# Patient Record
Sex: Female | Born: 1990 | Race: Black or African American | Hispanic: No | Marital: Married
Health system: Midwestern US, Community
[De-identification: ages and names within clinical notes are randomized; demographics above are authoritative.]

## PROBLEM LIST (undated history)

## (undated) DIAGNOSIS — J45909 Unspecified asthma, uncomplicated: Secondary | ICD-10-CM

## (undated) HISTORY — PX: ANKLE FRACTURE SURGERY: SHX122

---
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
## 2013-11-16 ENCOUNTER — Encounter (HOSPITAL_COMMUNITY): Payer: Self-pay | Admitting: Emergency Medicine

## 2013-11-16 ENCOUNTER — Emergency Department (INDEPENDENT_AMBULATORY_CARE_PROVIDER_SITE_OTHER)
Admission: EM | Admit: 2013-11-16 | Discharge: 2013-11-16 | Disposition: A | Discharge status: Home / Self Care | Payer: Self-pay | Source: Home / Self Care | Attending: Family Medicine | Admitting: Family Medicine

## 2013-11-16 ENCOUNTER — Emergency Department (INDEPENDENT_AMBULATORY_CARE_PROVIDER_SITE_OTHER): Payer: Self-pay

## 2013-11-16 DIAGNOSIS — J45901 Unspecified asthma with (acute) exacerbation: Secondary | ICD-10-CM

## 2013-11-16 MED ORDER — MINOCYCLINE HCL 100 MG PO CAPS: 100.0000 mg | ORAL_CAPSULE | Freq: Two times a day (BID) | ORAL | Status: AC

## 2013-11-16 MED ORDER — SODIUM CHLORIDE 0.9 % IN NEBU
INHALATION_SOLUTION | RESPIRATORY_TRACT | Status: AC
  Filled 2013-11-16: qty 3

## 2013-11-16 MED ORDER — IPRATROPIUM BROMIDE 0.02 % IN SOLN
RESPIRATORY_TRACT | Status: AC
  Filled 2013-11-16: qty 2.5

## 2013-11-16 MED ORDER — METHYLPREDNISOLONE 4 MG PO KIT: PACK | ORAL | Status: AC

## 2013-11-16 MED ORDER — METHYLPREDNISOLONE ACETATE 40 MG/ML IJ SUSP
80.0000 mg | Freq: Once | INTRAMUSCULAR | Status: AC
  Administered 2013-11-16: 80 mg via INTRAMUSCULAR

## 2013-11-16 MED ORDER — IPRATROPIUM BROMIDE 0.02 % IN SOLN
0.5000 mg | Freq: Once | RESPIRATORY_TRACT | Status: AC
  Administered 2013-11-16: 0.5 mg via RESPIRATORY_TRACT

## 2013-11-16 MED ORDER — METHYLPREDNISOLONE ACETATE 80 MG/ML IJ SUSP
INTRAMUSCULAR | Status: AC
  Filled 2013-11-16: qty 1

## 2013-11-16 MED ORDER — ALBUTEROL SULFATE (5 MG/ML) 0.5% IN NEBU
INHALATION_SOLUTION | RESPIRATORY_TRACT | Status: AC
  Filled 2013-11-16: qty 1

## 2013-11-16 MED ORDER — ALBUTEROL SULFATE (5 MG/ML) 0.5% IN NEBU
5.0000 mg | INHALATION_SOLUTION | Freq: Once | RESPIRATORY_TRACT | Status: AC
  Administered 2013-11-16: 5 mg via RESPIRATORY_TRACT

## 2013-11-16 NOTE — ED Notes (Signed)
Reported cough, fever for past few days

## 2013-11-16 NOTE — ED Provider Notes (Signed)
CSN: [id number]     Arrival date & time 11/16/13  1936 History   First MD Initiated Contact with Patient 11/16/13 1954     Chief Complaint  Patient presents with  . Cough   (Consider location/radiation/quality/duration/timing/severity/associated sxs/prior Treatment) Patient is a 22 y.o. female presenting with cough. The history is provided by the patient.  Cough Cough characteristics:  Productive Sputum characteristics:  Yellow Severity:  Moderate Onset quality:  Sudden Duration:  2 days Progression:  Worsening Chronicity:  New Smoker: yes   Associated symptoms: chills, fever, rhinorrhea, sore throat and wheezing   Risk factors comment:  Asthma   History reviewed. No pertinent past medical history. History reviewed. No pertinent past surgical history. History reviewed. No pertinent family history. History  Substance Use Topics  . Smoking status: Current Every Day Smoker  . Smokeless tobacco: Not on file  . Alcohol Use: Not on file   OB History   Grav Para Term Preterm Abortions TAB SAB Ect Mult Living                 Review of Systems  Constitutional: Positive for fever and chills.  HENT: Positive for congestion, rhinorrhea and sore throat.   Respiratory: Positive for cough and wheezing.   Gastrointestinal: Negative.     Allergies  Review of patient's allergies indicates no known allergies.  Home Medications   Current Outpatient Rx  Name  Route  Sig  Dispense  Refill  . methylPREDNISolone (MEDROL DOSEPAK) 4 MG tablet      follow package directions. Start on tues, take until finished.   21 tablet   0   . minocycline (MINOCIN,DYNACIN) 100 MG capsule   Oral   Take 1 capsule (100 mg total) by mouth 2 (two) times daily.   20 capsule   0    BP 118/67  Pulse 81  Temp(Src) 98.9 F (37.2 C) (Oral)  Resp 20  SpO2 100%  LMP 11/09/2013 Physical Exam  Nursing note and vitals reviewed. Constitutional: She is oriented to person, place, and time. She appears  well-developed and well-nourished.  HENT:  Head: Normocephalic.  Right Ear: External ear normal.  Left Ear: External ear normal.  Mouth/Throat: Oropharynx is clear and moist.  Eyes: Conjunctivae are normal. Pupils are equal, round, and reactive to light.  Neck: Normal range of motion. Neck supple.  Pulmonary/Chest: Effort normal. She has wheezes. She has rhonchi.  Abdominal: Soft. Bowel sounds are normal.  Lymphadenopathy:    She has no cervical adenopathy.  Neurological: She is alert and oriented to person, place, and time.  Skin: Skin is warm and dry.    ED Course  Procedures (including critical care time) Labs Review Labs Reviewed - No data to display Imaging Review Dg Chest 2 View  11/16/2013   CLINICAL DATA:  Chest pain and cough.  EXAM: CHEST  2 VIEW  COMPARISON:  01/06/2007.  FINDINGS: The heart size and mediastinal contours are within normal limits. Both lungs are clear. The visualized skeletal structures are unremarkable.  IMPRESSION: No active cardiopulmonary disease.   Electronically Signed   By: Loralie Champagne M.D.   On: 11/16/2013 20:38    EKG Interpretation    Date/Time:    Ventricular Rate:    PR Interval:    QRS Duration:   QT Interval:    QTC Calculation:   R Axis:     Text Interpretation:              MDM  Linna Hoff, MD 11/17/13 [phone]

## 2014-06-25 ENCOUNTER — Emergency Department (HOSPITAL_COMMUNITY): Payer: BC Managed Care – PPO

## 2014-06-25 ENCOUNTER — Encounter (HOSPITAL_COMMUNITY): Payer: Self-pay | Admitting: Emergency Medicine

## 2014-06-25 ENCOUNTER — Emergency Department (HOSPITAL_COMMUNITY)
Admission: EM | Admit: 2014-06-25 | Discharge: 2014-06-25 | Disposition: A | Discharge status: Home / Self Care | Payer: BC Managed Care – PPO | Attending: Emergency Medicine | Admitting: Emergency Medicine

## 2014-06-25 DIAGNOSIS — S46911A Strain of unspecified muscle, fascia and tendon at shoulder and upper arm level, right arm, initial encounter: Secondary | ICD-10-CM

## 2014-06-25 DIAGNOSIS — J45909 Unspecified asthma, uncomplicated: Secondary | ICD-10-CM | POA: Insufficient documentation

## 2014-06-25 DIAGNOSIS — IMO0002 Reserved for concepts with insufficient information to code with codable children: Secondary | ICD-10-CM | POA: Insufficient documentation

## 2014-06-25 DIAGNOSIS — F17200 Nicotine dependence, unspecified, uncomplicated: Secondary | ICD-10-CM | POA: Insufficient documentation

## 2014-06-25 DIAGNOSIS — Y9389 Activity, other specified: Secondary | ICD-10-CM | POA: Insufficient documentation

## 2014-06-25 DIAGNOSIS — Z8781 Personal history of (healed) traumatic fracture: Secondary | ICD-10-CM | POA: Insufficient documentation

## 2014-06-25 DIAGNOSIS — X500XXA Overexertion from strenuous movement or load, initial encounter: Secondary | ICD-10-CM | POA: Insufficient documentation

## 2014-06-25 DIAGNOSIS — Y929 Unspecified place or not applicable: Secondary | ICD-10-CM | POA: Insufficient documentation

## 2014-06-25 DIAGNOSIS — Z791 Long term (current) use of non-steroidal anti-inflammatories (NSAID): Secondary | ICD-10-CM | POA: Insufficient documentation

## 2014-06-25 DIAGNOSIS — M25519 Pain in unspecified shoulder: Secondary | ICD-10-CM | POA: Insufficient documentation

## 2014-06-25 HISTORY — DX: Unspecified asthma, uncomplicated: J45.909

## 2014-06-25 MED ORDER — NAPROXEN 500 MG PO TABS: 500.0000 mg | ORAL_TABLET | Freq: Two times a day (BID) | ORAL | Status: AC

## 2014-06-25 NOTE — ED Provider Notes (Signed)
Medical screening examination/treatment/procedure(s) were performed by non-physician practitioner and as supervising physician I was immediately available for consultation/collaboration.   EKG Interpretation None       Jahsiah Carpenter, MD 06/25/14 2011 

## 2014-06-25 NOTE — ED Notes (Addendum)
Pt reports right shoulder pain ever since waking up this am. Pt denies any known injury. No obvious deformity noted.

## 2014-06-25 NOTE — ED Provider Notes (Signed)
CSN: [id number]     Arrival date & time 06/25/14  1504 History   First MD Initiated Contact with Patient 06/25/14 1512     Chief Complaint  Patient presents with  . Shoulder Pain     (Consider location/radiation/quality/duration/timing/severity/associated sxs/prior Treatment) The history is provided by the patient.   Diane Pratt is a 23 y.o. female presenting with pain in her right shoulder which she woke with this am which is constant, sharp and worsened with movement and palpation.  She denies injury but describes possible chronic overuse.  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 She denies radiation of pain or weakness, numbness in her upper extremities.  Also denies neck pain or injury. She has taken no medicines for this prior to arrival.    Past Medical History  Diagnosis Date  . Asthma    Past Surgical History  Procedure Laterality Date  . Ankle fracture surgery Left    History reviewed. No pertinent family history. History  Substance Use Topics  . Smoking status: Current Every Day Smoker -- 0.25 packs/day  . Smokeless tobacco: Not on file  . Alcohol Use: No   OB History   Grav Para Term Preterm Abortions TAB SAB Ect Mult Living                 Review of Systems  Constitutional: Negative for fever.  Musculoskeletal: Positive for arthralgias. Negative for joint swelling and myalgias.  Neurological: Negative for weakness and numbness.      Allergies  Review of patient's allergies indicates no known allergies.  Home Medications   Prior to Admission medications   Medication Sig Start Date End Date Taking? Authorizing Provider  methylPREDNISolone (MEDROL DOSEPAK) 4 MG tablet follow package directions. Start on tues, take until finished. 11/16/13   Linna HoffJames D Kindl, MD  minocycline (MINOCIN,DYNACIN) 100 MG capsule Take 1 capsule (100 mg total) by mouth 2 (two) times daily.  11/16/13   Linna HoffJames D Kindl, MD  naproxen (NAPROSYN) 500 MG tablet Take 1 tablet (500 mg total) by mouth 2 (two) times daily with a meal. 06/25/14   Burgess AmorJulie Mahala Rommel, PA-C   BP 108/58  Pulse 80  Temp(Src) 98.1 F (36.7 C) (Oral)  Resp 18  Ht 5\' 3"  (1.6 m)  Wt 125 lb (56.7 kg)  BMI 22.15 kg/m2  SpO2 99%  LMP 06/18/2014 Physical Exam  Constitutional: She appears well-developed and well-nourished.  HENT:  Head: Atraumatic.  Neck: Normal range of motion.  Cardiovascular:  Pulses equal bilaterally  Musculoskeletal: She exhibits tenderness.       Right shoulder: She exhibits bony tenderness. She exhibits no swelling, no effusion, no crepitus, no deformity, no spasm, normal pulse and normal strength.  ttp with passive and active ROM of right shoulder.  Point tender over anterior humeral head.  No edema, rash, erythema, visible signs of trauma.  Neurological: She is alert. She has normal strength. She displays normal reflexes. No sensory deficit.  Skin: Skin is warm and dry.  Psychiatric: She has a normal mood and affect.    ED Course  Procedures (including critical care time) Labs Review Labs Reviewed - No data to display  Imaging Review Dg Shoulder Right  06/25/2014   CLINICAL DATA:  Chronic right shoulder pain.  EXAM: RIGHT SHOULDER - 2+ VIEW  COMPARISON:  None.  FINDINGS: The humerus is located and the acromioclavicular joint is  intact. No evidence of arthropathy is seen about the shoulder. There is no focal bony lesion. Imaged lung parenchyma and ribs appear normal. Soft tissue structures about the shoulder also appear normal.  IMPRESSION: Normal examination.   Electronically Signed   By: Drusilla Kanner M.D.   On: 06/25/2014 16:01     EKG Interpretation None      MDM   Final diagnoses:  Shoulder strain, right, initial encounter    Patients labs and/or radiological studies were viewed and considered during the medical decision making and disposition process. Naproxen, ice,   Rest.  Referral to Dr Romeo Apple if not improving over the next 7 days.    Burgess Amor, PA-C 06/25/14 [phone]

## 2014-06-25 NOTE — Discharge Instructions (Signed)
Strain A strain is an injury to a muscle or the tissue that connects muscles to bones (tendon). In a strain injury, the muscle or tendon is either stretched or torn. Muscles are more susceptible to strains if they cross two joints, such as:  Hamstrings.  Quadriceps.  Calves.  Biceps. There are three categories of strains:  A first-degree strain is a small tear in the muscle. There is no lengthening of the muscle, but pain may be present with contraction of the muscle.  A second-degree strain is a small tear in the muscle accompanied by lengthening of the muscle. Muscles with a second-degree strain are still able to function.  A third-degree strain is a complete tear of the muscle. Muscles with a third-degree strain cannot function properly. Strains often have bleeding and bruising within the muscle. SYMPTOMS   Pain, tenderness, redness or bruising, and swelling in the area of injury.  Loss of normal mobility of the injured joint. CAUSES  A sudden force exerted on a muscle or tendon that it cannot withstand usually causes strains. This may be due to a sudden overload of a contracted muscle, overuse, or sudden increase or change in activity.  RISK INCREASES WITH:  Trauma.  Poor strength and flexibility.  Failure to warm-up properly before activity.  Return to activity before healing is complete. PREVENTION  Warm-up and stretch properly before and activity.  Maintain physical fitness:  Joint flexibility.  Muscle strength.  Endurance and conditioning.  Strengthen weak muscles with exercises to prevent recurrence. PROGNOSIS  If treated properly, strains are usually curable. The time it takes to recover is related to the severity of the injury and usually varies from 2 to 8 weeks. RELATED COMPLICATIONS   Re-injury or recurrence of symptoms, permanent weakness.  Joint stiffness if the strain is severe and rehabilitation is incomplete.  Delayed healing or resolution of  symptoms if sports are resumed before rehabilitation is complete.  Excessive bleeding into muscle, especially if taking anti-inflammatory medicines. This can lead to delayed recovery and injury to nerves, muscle, and blood vessels; this is an emergency. TREATMENT  Treatment initially involves ice and medicine to help reduce pain and inflammation. Use of the affected muscle should be limited by a:  Brace.  Elastic bandage wrapping.  Splint.  Cast.  Sling. Strengthening and stretching exercises may be necessary after immobilization to prevent joint stiffness. These exercises may be completed at home or with a therapist. If the tendon is torn, then surgery may be necessary to repair it.  MEDICATION   Prescription pain relievers may be prescribed. Use only as directed and only as much as you need  Ointments applied to the skin may be helpful such as icy hot. HEAT AND COLD  Cold treatment (icing) relieves pain and reduces inflammation. Cold treatment should be applied for 10 to 15 minutes every 2 to 3 hours for inflammation and pain and immediately after any activity that aggravates your symptoms. Use ice packs or massage the area with a piece of ice (ice massage).  Heat treatment may be used prior to performing the stretching and strengthening activities prescribed by your caregiver, physical therapist, or athletic trainer. Use a heat pack or soak your injury in warm water. SEEK MEDICAL CARE IF:   Symptoms get worse or do not improve despite treatment.  Pain becomes intolerable.  You experience numbness or tingling.  Toes or fingernails become cold or develop a blue, gray, or dusky color.  New, unexplained symptoms develop (drugs used  in treatment may produce side effects). Document Released: 11/19/2005 Document Revised: 02/11/2012 Document Reviewed: 03/03/2009 [facility] Patient Information 2015 [place], [place]. This information is not intended to replace advice given to you by your  health care provider. Make sure you discuss any questions you have with your health care provider.

## 2014-07-06 ENCOUNTER — Encounter: Payer: Self-pay | Admitting: Orthopedic Surgery

## 2014-07-06 ENCOUNTER — Ambulatory Visit: Payer: BC Managed Care – PPO | Admitting: Orthopedic Surgery

## 2016-01-24 IMAGING — CR DG SHOULDER 2+V*R*
3 series · 3 of 3 positions shown · non-contrast
Comparison: None.

CLINICAL DATA: Chronic right shoulder pain.

EXAM:
RIGHT SHOULDER - 2+ VIEW

[view not recorded (1 of 3)]
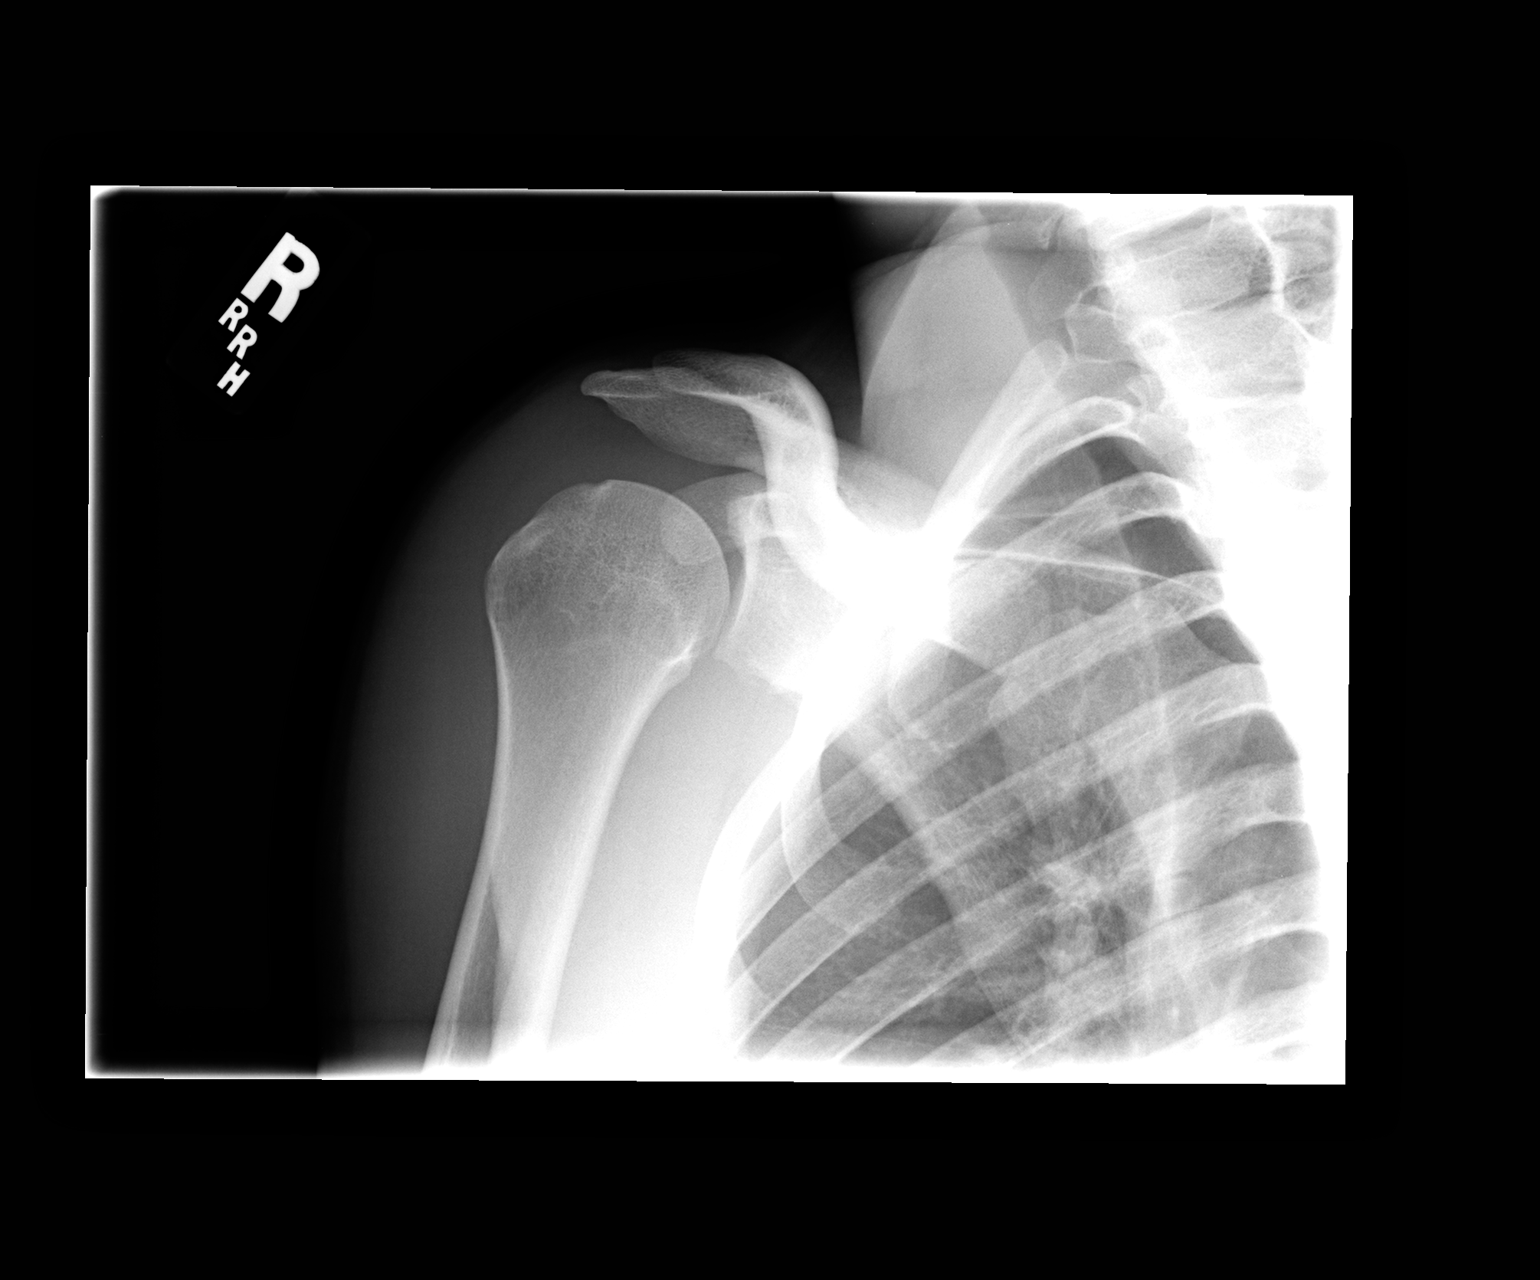

[view not recorded (2 of 3)]
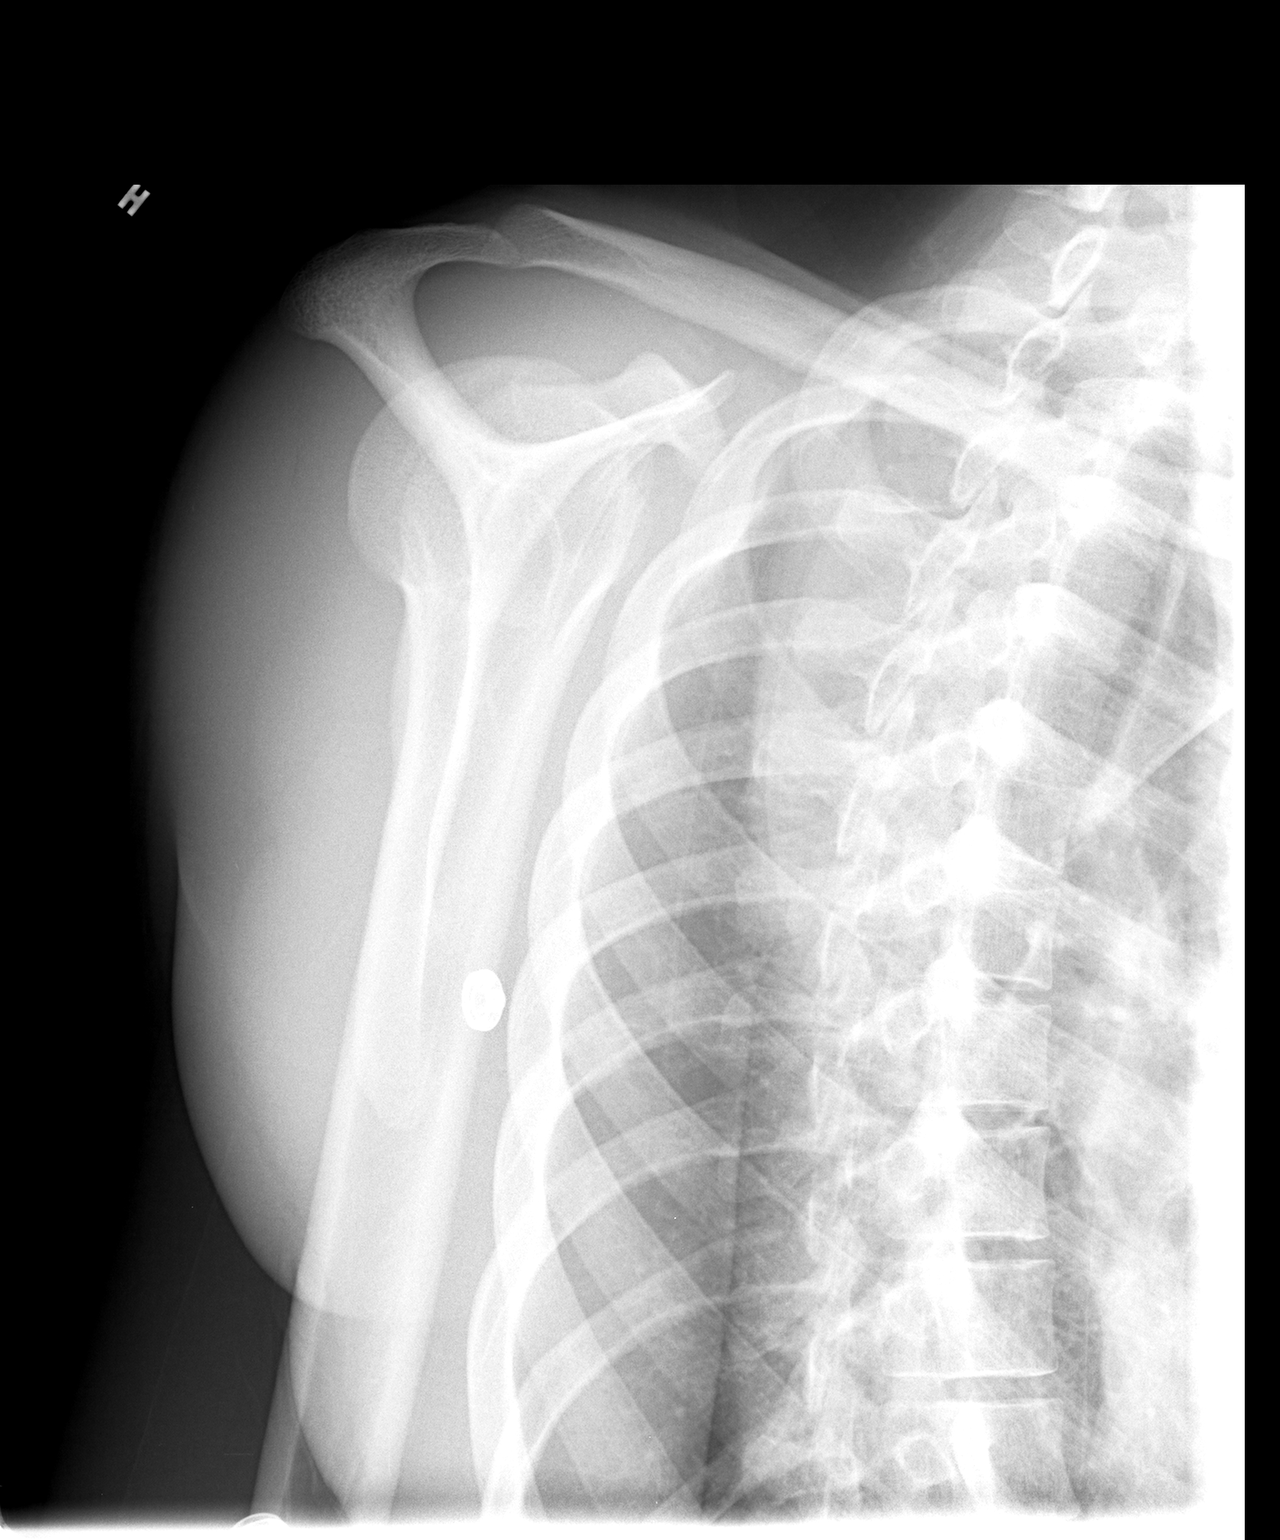

[view not recorded (3 of 3)]
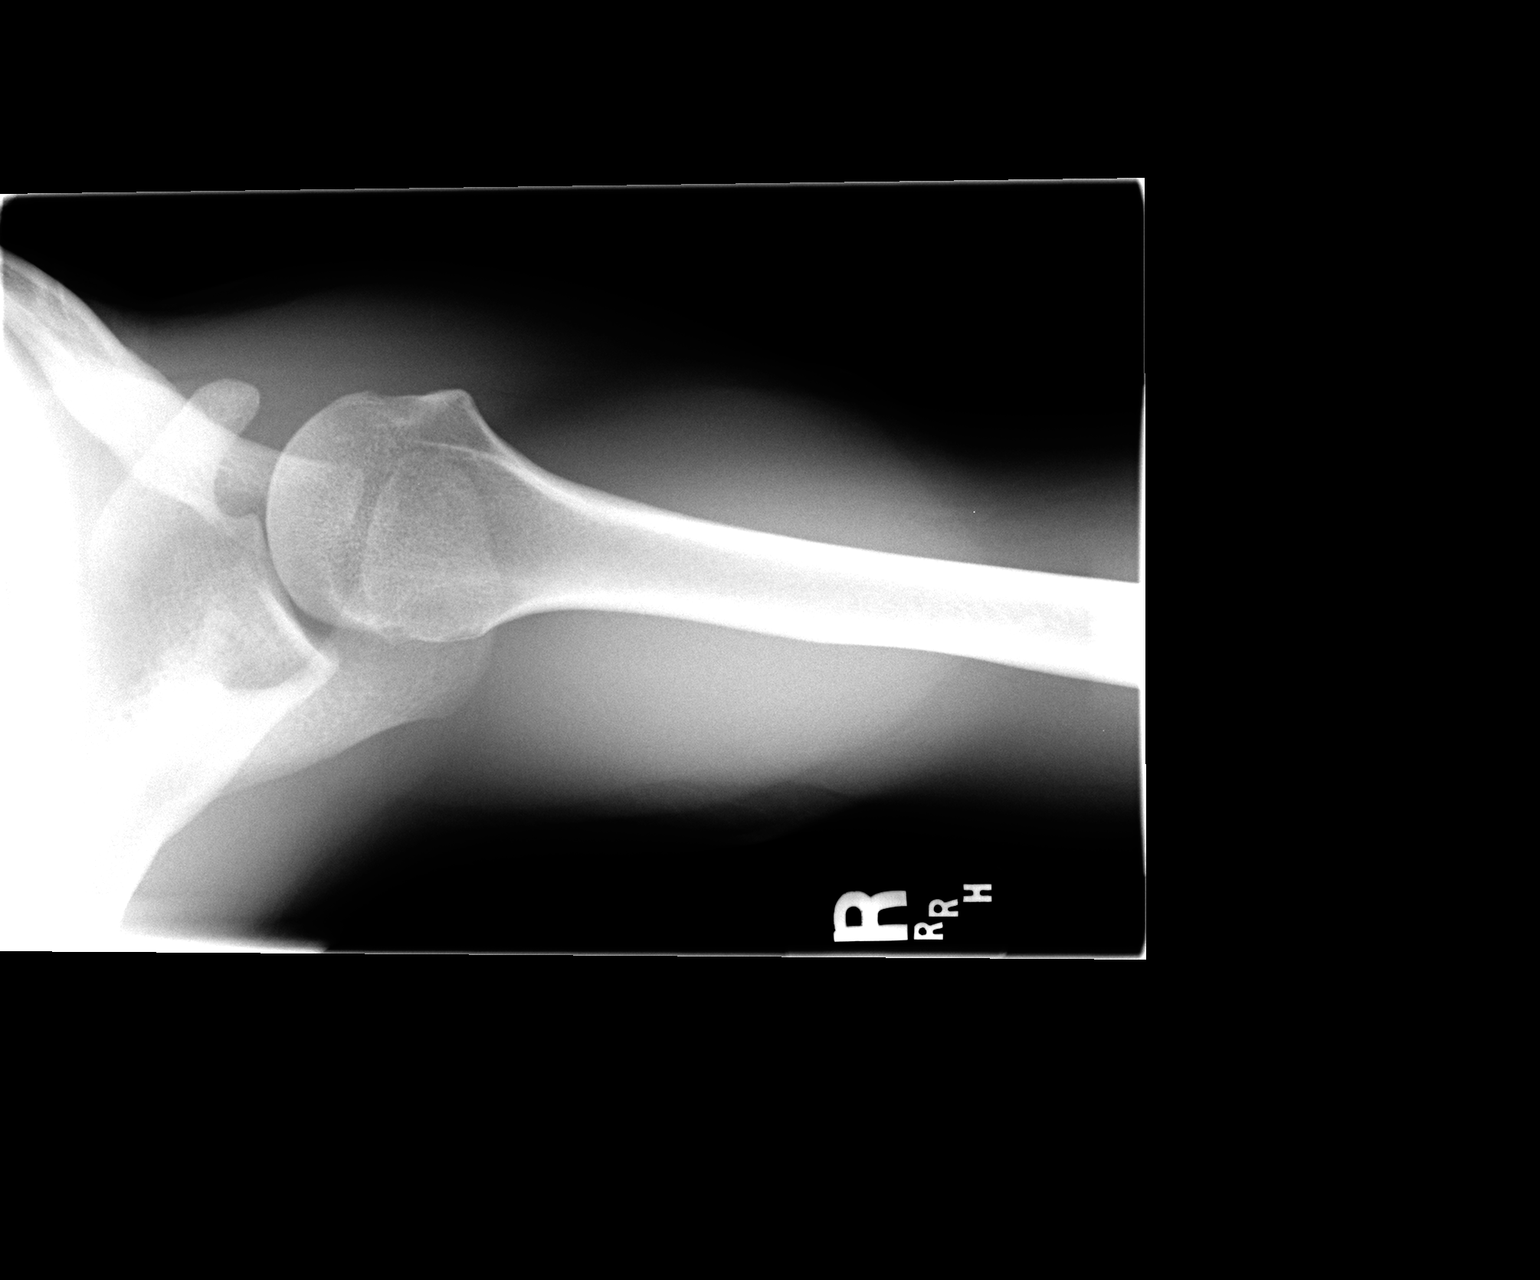

[3 of 3 positions shown; findings below may reference images not displayed]

FINDINGS: The humerus is located and the acromioclavicular joint is intact. No
evidence of arthropathy is seen about the shoulder. There is no
focal bony lesion. Imaged lung parenchyma and ribs appear normal.
Soft tissue structures about the shoulder also appear normal.
IMPRESSION: Normal examination.

## 2018-05-04 NOTE — ED Provider Notes (Signed)
Formatting of this note is different from the original.    [facility]    Time of Arrival:  05/04/2018 11:49 AM    (R11.2) Nausea and vomiting, intractability of vomiting not specified, unspecified vomiting type    (R19.7) Diarrhea, unspecified type    (R10.13) Epigastric pain    ED Course/Medical Decision Making:     1:19 PM labs reviewed. WBC 16.6. She continues to have abdominal pain epigastric as well as LLQ " soreness"  UA pending    2:42 PM  Smiling, feels much better. Pain resolved  No additional vomiting or diarrhea since receiving Zofran      Disposition:  Home    .    New Prescriptions    FAMOTIDINE (PEPCID) 20 MG PO TABS    Take 1 Tab by Mouth Every 12 hours.    ONDANSETRON (ZOFRAN) 4 MG PO TBDL    Take 1 Tab by Mouth Every 8 Hours As Needed for Nausea (PRN FOR NAUSEA AND VOMITING). ALLOW TABLET TO DISSOLVE ON TONGUE.     Chief Complaint   Patient presents with   ? ABDOMINAL PAIN   ? VOMITING     This is a 27 yo female presented to the Emergency Dept with a complaint of abdominal pain, NVD that started at 4 am. She states she went out to a bar last night and had a " few drinks", but not excessive. The patient has a history of vapes. She states she had temp 101 this morning. She was afebrile on arrival.  She denies SOB, chest pain,  dizziness, headache, constipation or melena.     Pain is constant. No alleviating or aggravating factors    The patient arrived to the Emergency Department with stable vital signs and O2 sat 99%    Review of Systems:  Constitutional: Negative for fever and chills.   HENT: Negative for ear pain, nosebleeds, congestion, sore throat, facial swelling, neck pain and sinus pressure.    Eyes: Negative for photophobia, discharge and visual disturbance.   Respiratory: Negative for cough, shortness of breath, wheezing and stridor.    Cardiovascular: Negative for chest pain and palpitations.   Gastrointestinal: Positive for nausea, vomiting, abdominal pain and  diarrhea. Negative for constipation.   Genitourinary: Negative for dysuria, urgency, frequency and flank pain.   Musculoskeletal: Negative for myalgias, back pain and joint swelling.   Skin: Negative for wound.   Neurological: Negative for dizziness, seizures, weakness, light-headedness, numbness and headaches.   Psychiatric/Behavioral: Negative for behavioral problems and agitation. The patient is not nervous/anxious.      No past medical history on file.  No past surgical history on file.  No family history on file.  Social History     Socioeconomic History   ? Marital status: Married     Spouse name: Not on file   ? Number of children: Not on file   ? Years of education: Not on file   ? Highest education level: Not on file   Occupational History   ? Not on file   Social Needs   ? Financial resource strain: Not on file   ? Food insecurity:     Worry: Not on file     Inability: Not on file   ? Transportation needs:     Medical: Not on file     Non-medical: Not on file   Tobacco Use   ? Smoking status: Not on file   Substance and Sexual Activity   ?  Alcohol use: Not on file   ? Drug use: Not on file   ? Sexual activity: Not on file   Lifestyle   ? Physical activity:     Days per week: Not on file     Minutes per session: Not on file   ? Stress: Not on file   Relationships   ? Social connections:     Talks on phone: Not on file     Gets together: Not on file     Attends religious service: Not on file     Active member of club or organization: Not on file     Attends meetings of clubs or organizations: Not on file     Relationship status: Not on file   ? Intimate partner violence:     Fear of current or ex partner: Not on file     Emotionally abused: Not on file     Physically abused: Not on file     Forced sexual activity: Not on file   Other Topics Concern   ? Not on file   Social History Narrative   ? Not on file     No outpatient medications have been marked as taking for the 05/04/18 encounter [facility] Encounter).      Allergies   Allergen Reactions   ? Vicodin [Hydrocodone-Acetaminophen] swelling     Vital Signs:  Patient Vitals for the past 72 hrs:   Temp Heart Rate Pulse Resp BP BP Mean SpO2 Weight   05/04/18 1332 -- -- 68 -- -- -- 96 % --   05/04/18 1328 -- -- -- -- 105/66 78 MM HG -- --   05/04/18 1148 97.5 F (36.4 C) 80 -- 18 123/71 88 MM HG 99 % 63.5 kg (140 lb)     Physical Exam   Constitutional: She is oriented to person, place, and time. No distress.   HENT:   Head: Normocephalic and atraumatic.   Mouth/Throat: Oropharynx is clear and moist. No oropharyngeal exudate.   Eyes: Pupils are equal, round, and reactive to light. Conjunctivae and EOM are normal. Right eye exhibits no discharge.   Neck: Normal range of motion. Neck supple. No tracheal deviation present. No thyromegaly present.   Cardiovascular: Normal rate, regular rhythm, normal heart sounds and intact distal pulses.   No murmur heard.  Pulmonary/Chest: Effort normal and breath sounds normal. No respiratory distress. She has no wheezes. She has no rales.   Abdominal: Soft. Bowel sounds are normal. She exhibits no distension. There is no tenderness. There is no rebound and no guarding.   Actively vomiting on arrival   She has diffuse tenderness but most pain is epigastric    Musculoskeletal: Normal range of motion. She exhibits no edema, tenderness or deformity.   Lymphadenopathy:     She has no cervical adenopathy.   Neurological: She is alert and oriented to person, place, and time. No cranial nerve deficit.   Skin: Skin is warm and dry. No rash noted. She is not diaphoretic. No erythema. No pallor.   Psychiatric: Judgment normal.   Nursing note and vitals reviewed.    Diagnostics:  Labs:    Results for orders placed or performed during the hospital encounter of 05/04/18   COMPREHENSIVE METABOLIC PANEL   Result Value Ref Range    Potassium 3.5 3.5 - 5.5 mmol/L    Sodium 142 133 - 145 mmol/L    Chloride 106 98 - 110 mmol/L    Glucose 137 (H) 70 -  99 mg/dL     Calcium 9.2 8.4 - 16.1 mg/dL    Albumin 4.7 3.5 - 5.0 g/dL    SGPT (ALT) 24 5 - 40 U/L    SGOT (AST) 36 10 - 37 U/L    Bilirubin Total 1.0 0.2 - 1.2 mg/dL    Alkaline Phosphatase 72 25 - 115 U/L    BUN 8 6 - 22 mg/dL    CO2 21 20 - 32 mmol/L    Creatinine 0.6 0.5 - 1.2 mg/dL    eGFR African American >60.0 >60.0    eGFR Non African American >60.0 >60.0    Globulin 3.4 2.0 - 4.0 g/dL    A/G Ratio 1.4 1.1 - 2.6 ratio    Total Protein 8.1 6.4 - 8.3 g/dL    Anion Gap 09.6 mmol/L   LIPASE   Result Value Ref Range    LIPASE <20 7 - 60 U/L   TROPONIN   Result Value Ref Range    Troponin (T) Quantitative <0.01 0.00 - 0.01 ng/mL   CBC WITH DIFFERENTIAL AUTO   Result Value Ref Range    WBC x 10*3 16.6 (H) 4.0 - 11.0 K/uL    RBC x 10^6 5.00 3.80 - 5.20 M/uL    HGB 13.6 11.7 - 15.5 g/dL    HCT 04.5 40.9 - 81.1 %    MCV 81 80 - 95 fL    MCH 27 26 - 34 pg    MCHC 33 31 - 36 g/dL    RDW 91.4 78.2 - 95.6 %    Platelet 337 140 - 440 K/uL    MPV 9.3 9.0 - 13.0 fL    Segmented Neutrophils 79 (H) 40 - 75 %    Lymphocytes 14 (L) 20 - 45 %    Monocytes 4 3 - 12 %    Eosinophil 4 0 - 6 %    Basophils 1 0 - 2 %    Absolute Neutrophils 13.1 (H) 1.8 - 7.7 K/uL    Absolute Lymphocytes 2.2 1.0 - 4.8 K/uL    Absolute Monocyte Count 0.6 0.1 - 1.0 K/uL    Absolute Eosinophil 0.6 (H) 0.0 - 0.5 K/uL    Absolute Basophil Count 0.1 0.0 - 0.2 K/uL   Urinalysis (Lab)   Result Value Ref Range    Urine pH 9.0 (H) 5.0 - 8.0 pH    Urine Protein Screen Negative Negative, Trace mg/dL    Urine Glucose Negative Negative mg/dL    Urine Ketones Negative Negative, NOT TESTED mg/dL    Urine Occult Blood Negative Negative    Urine Specific Gravity 1.017 1.005 - 1.030    Urine Nitrite Negative Negative    Urine Leukocyte Esterase Negative Negative    Urine Bilirubin Negative Negative    Urine Urobilinogen <2.0 <2.0 mg/dL   PREGNANCY URINE (Lab)   Result Value Ref Range    PREGNANCY URINE Negative Negative     EKG 12-LEAD           Electronically signed by Jasper Riling, MD at 05/04/2018  3:16 PM EDT

## 2018-05-04 NOTE — ED Triage Notes (Signed)
Formatting of this note might be different from the original.  Pt c/o abd pain and vomiting onset last night. Pt took zofran this morning with no relief.  Electronically signed by Ivan AnchorsHowe, Amy M, RN at 05/04/2018 11:50 AM EDT

## 2018-05-04 NOTE — ED Notes (Signed)
Formatting of this note might be different from the original.  Pain assessment on discharge was improved.  Condition stable.  Patient discharged to home.  Patient education was completed:  yes  Education taught to:  patient  Teaching method used was discussion and handout.  Understanding of teaching was good.  Patient was discharged ambulatory.  Discharged with family.  Valuables were given to: patient.  Prescriptions given x2.  Electronically signed by Rosine BeatSmith, Lisa M, RN at 05/04/2018  3:02 PM EDT

## 2018-05-04 NOTE — ED Notes (Signed)
Formatting of this note might be different from the original.  Assumed care of pt. Triage note reviewed. Pt actively vomiting.   Electronically signed by Rosine BeatSmith, Lisa M, RN at 05/04/2018 11:59 AM EDT
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
## 2023-01-12 ENCOUNTER — Emergency Department: Admit: 2023-01-12 | Primary: Family Medicine

## 2023-01-12 ENCOUNTER — Inpatient Hospital Stay
Admit: 2023-01-12 | Discharge: 2023-01-12 | Disposition: A | Discharge status: Home / Self Care | Attending: Student in an Organized Health Care Education/Training Program

## 2023-01-12 ENCOUNTER — Emergency Department: Primary: Family Medicine

## 2023-01-12 DIAGNOSIS — W19XXXA Unspecified fall, initial encounter: Secondary | ICD-10-CM

## 2023-01-12 DIAGNOSIS — S39012A Strain of muscle, fascia and tendon of lower back, initial encounter: Secondary | ICD-10-CM

## 2023-01-12 MED ORDER — NAPROXEN 500 MG PO TABS
500 | ORAL_TABLET | Freq: Two times a day (BID) | ORAL | 0 refills | Status: AC
Start: 2023-01-12 — End: 2023-01-26

## 2023-01-12 MED ORDER — LIDOCAINE 5 % EX PTCH
5 | MEDICATED_PATCH | Freq: Every day | CUTANEOUS | 0 refills | Status: AC
Start: 2023-01-12 — End: 2023-01-22

## 2023-01-12 MED ORDER — ACETAMINOPHEN 325 MG PO TABS
325 | ORAL | Status: AC
Start: 2023-01-12 — End: 2023-01-12
  Administered 2023-01-12: 15:00:00 650 mg via ORAL

## 2023-01-12 MED ORDER — KETOROLAC TROMETHAMINE 15 MG/ML IJ SOLN
15 | INTRAMUSCULAR | Status: AC
Start: 2023-01-12 — End: 2023-01-12
  Administered 2023-01-12: 15:00:00 15 mg via INTRAMUSCULAR

## 2023-01-12 MED ORDER — HYDROCODONE-ACETAMINOPHEN 5-325 MG PO TABS
5-325 | ORAL | Status: AC
Start: 2023-01-12 — End: 2023-01-12
  Administered 2023-01-12: 15:00:00 1 via ORAL

## 2023-01-12 MED ORDER — ACETAMINOPHEN 500 MG PO TABS
500 | ORAL_TABLET | Freq: Four times a day (QID) | ORAL | 0 refills | Status: AC | PRN
Start: 2023-01-12 — End: 2023-01-26

## 2023-01-12 MED ORDER — LIDOCAINE 4 % EX PTCH
4 | CUTANEOUS | Status: DC
Start: 2023-01-12 — End: 2023-01-12
  Administered 2023-01-12: 15:00:00 1 via TRANSDERMAL

## 2023-01-12 MED FILL — KETOROLAC TROMETHAMINE 15 MG/ML IJ SOLN: 15 MG/ML | INTRAMUSCULAR | Qty: 1

## 2023-01-12 MED FILL — ACETAMINOPHEN 325 MG PO TABS: 325 MG | ORAL | Qty: 2

## 2023-01-12 MED FILL — LIDOCAINE PAIN RELIEF 4 % EX PTCH: 4 % | CUTANEOUS | Qty: 1

## 2023-01-12 MED FILL — HYDROCODONE-ACETAMINOPHEN 5-325 MG PO TABS: 5-325 MG | ORAL | Qty: 1

## 2023-01-12 NOTE — Discharge Instructions (Addendum)
You were evaluated for low back pain, fall, shoulder pain which has arthritis .  Based on your work-up it was deemed that she was stable for discharge.  Please pick up your medication of naproxen, tylenol, lidocaine patch which was prescribed to you.  Please follow-up with your primary care physician if you have any further concerns and go over your work-up.  If you experience any chest pain, shortness of breath, worsening abdominal pain, vomiting blood, worsening headache, seizures, or any worsening of your symptoms please return to the emergency department immediately.  If you have any pending results or any further questions please contact the emergency department at [phone].

## 2023-01-12 NOTE — ED Triage Notes (Signed)
PATIENT PRESENTED TO THE EMERGENCY DEPT WITH A COMPLAINT OF HAVING A FALL WHILE AT WORK THIS MORNING, PATIENT TRIED TO BREAK FALL HOWEVER FELL ON HER BUTT NOW HAVING SHARP PAIN TO LOWER BACK AND SHOOTS UP TO ARM AND SHOULDER. PATIENT RATES PAIN 10/10 ON PAIN SCALE. PATIENT HAS SMALL ABRASION TO LEFT INDEX FINGER.       PATIENT ALERT AND ORIENTED X 4, PATIENT BREATHES FREELY ON ROOM AIR IN NIL CARDIOPULMOANRY DISTRESS

## 2023-01-12 NOTE — ED Notes (Signed)
PATIENT REQUESTING TO STAY IN MAIN LOBBY UNTIL THE PROVIDER IS READY.

## 2023-01-12 NOTE — ED Provider Notes (Signed)
EMERGENCY DEPARTMENT HISTORY AND PHYSICAL EXAM    9:55 AM      Date: 01/12/2023  Patient Name: Melissa Clark    History of Presenting Illness     Chief Complaint   Patient presents with    Back Pain    Fall       History From: Patient  HPI  32 year old female with no pertinent past medical history who presents with fall.  Patient says she was at a [employer] and had a slip and fall.  Patient landed on her lower back, and right shoulder.  Having moderate to severe lower back pain, radiating to bilateral paraspinal region.  Patient having right shoulder pain, mild to moderate, aching, intermittent.  When assessing ROS she denies any saddle anesthesia, bowel or urinary incontinence, numbness and tingling, chest pain, shortness of breath, abdominal pain, or any other changes.     Nursing Notes were all reviewed and agreed with or any disagreements were addressed in the HPI.    PCP: Shelly Bombard, MD    Current Facility-Administered Medications   Medication Dose Route Frequency Provider Last Rate Last Admin    lidocaine 4 % external patch 1 patch  1 patch TransDERmal NOW Manya Silvas, MD   1 patch at 01/12/23 0950     Current Outpatient Medications   Medication Sig Dispense Refill    naproxen (NAPROSYN) 500 MG tablet Take 1 tablet by mouth 2 times daily (with meals) for 14 days 28 tablet 0    acetaminophen (TYLENOL) 500 MG tablet Take 1 tablet by mouth 4 times daily as needed for Pain 56 tablet 0    lidocaine (LIDODERM) 5 % Place 1 patch onto the skin daily for 10 days 12 hours on, 12 hours off. 10 patch 0       Past History     Past Medical History:  No past medical history on file.    Past Surgical History:  No past surgical history on file.    Family History:  No family history on file.    Social History:  Social History     Tobacco Use    Smoking status: Never    Smokeless tobacco: Never   Substance Use Topics    Alcohol use: Yes    Drug use: Yes     Types: Marijuana (Weed)       Allergies:  No Known  Allergies      Review of Systems       Review of Systems   Constitutional:  Negative for activity change, appetite change and fever.   HENT:  Negative for ear pain and facial swelling.    Eyes:  Negative for pain.   Respiratory:  Negative for chest tightness and shortness of breath.    Cardiovascular:  Negative for chest pain and leg swelling.   Gastrointestinal:  Negative for abdominal distention, abdominal pain, blood in stool, constipation and diarrhea.   Genitourinary:  Negative for dysuria and flank pain.   Musculoskeletal:  Positive for back pain. Negative for arthralgias.        Right shoulder pain    Neurological:  Negative for dizziness, seizures, syncope, numbness and headaches.         Physical Exam   BP 116/68   Pulse 75   Temp 98.1 F (36.7 C) (Oral)   Resp 18   Ht 1.626 m (5\' 4" )   Wt 68 kg (150 lb)   SpO2 99%   BMI 25.75 kg/m  Physical Exam  Constitutional:       Appearance: Normal appearance. He is not toxic-appearing.   HENT:      Head: Normocephalic and atraumatic.   Eyes:      Extraocular Movements: Extraocular movements intact.      Conjunctiva/sclera: Conjunctivae normal.      Pupils: Pupils are equal, round, and reactive to light.   Cardiovascular:      Rate and Rhythm: Normal rate.   Pulmonary:      Effort: Pulmonary effort is normal.      Breath sounds: Normal breath sounds.   Abdominal:      General: Abdomen is flat. Bowel sounds are normal.      Palpations: Abdomen is soft.   Musculoskeletal:         General: Normal range of motion.      Cervical back: Normal range of motion and neck supple.      Comments: TTP to right shoulder, TTP to L spine and paraspinal lower back. TTP to right femur    Skin:     General: Skin is warm.      Capillary Refill: Capillary refill takes less than 2 seconds.   Neurological:      General: No focal deficit present.      Mental Status: He is alert. Mental status is at baseline.           Diagnostic Study Results     Labs -  No results found for  this or any previous visit (from the past 12 hour(s)).    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      CT LUMBAR SPINE WO CONTRAST    (Results Pending)         Medical Decision Making   I am the first provider for this patient.    I reviewed the vital signs, available nursing notes, past medical history, past surgical history, family history and social history.    Vital Signs-Reviewed the patient's vital signs.      EKG: none       ED Course: Progress Notes, Reevaluation, and Consults:    Provider Notes (Medical Decision Making):   MDM  32 year old female who presents with fall.  Will consider MSK contusion.  Low suspicion for spinal fracture versus cord compression as patient has no red flag symptoms.  Will consider right shoulder contusion.  Low suspicion for dislocation or fracture as patient is able to have full range of motion of the right shoulder.  Will consider thigh contusion.  Low suspicion for femur fracture due to mechanism of injury.  Will obtain imaging and provide symptomatic relief.  Patient x-ray of the right shoulder shows arthritis.  No signs of dislocation or fractures.  Femur shows no signs of acute injury.  At this time patient is unable to urinate to provide hCG sample.  She would like to defer CT imaging of the L-spine.  Discussed workup with patient.  She agrees with plan.  Patient will be discharged.    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  She would like to defer CT scan of lumbar spine.  Discussed workup with patient.  She will follow-up with her PCP in the outpatient setting.  Given strict return precautions.  All questions answered.  Patient discharged. [KS]       ED Course User Index  [KS] Manya Silvas, MD       Patient was given the following medications:  Medications   lidocaine 4 % external patch 1 patch (1 patch TransDERmal Patch Applied 01/12/23 0950)   ketorolac (TORADOL) injection 15 mg (15 mg IntraMUSCular Given 01/12/23 0950)   acetaminophen (TYLENOL) tablet 650 mg (650 mg Oral Given 01/12/23 0950)   HYDROcodone-acetaminophen (NORCO) 5-325 MG per tablet 1 tablet (1 tablet Oral Given 01/12/23 0950)       CONSULTS: (Who and What was discussed)  None    Chronic Conditions: refer to hpi    Social Determinants affecting Dx or Tx: None    Records Reviewed (source and summary of external notes): Nursing Notes    Procedures    Critical Care Time: none       Diagnosis     Clinical Impression:   1. Fall, initial encounter    2. Strain of lumbar region, initial encounter    3. Shoulder arthritis        Disposition: [place]  [address]  [place] [address]  [phone]    Go to   As needed, If symptoms worsen       Disclaimer: Sections of this note are dictated using utilizing voice recognition software.  Minor typographical errors may be present. If questions arise, please do not hesitate to contact me or call our department.         Manya Silvas, MD  01/12/23 [phone]
